# Patient Record
Sex: Female | Born: 1960 | Race: Black or African American | Hispanic: No | Marital: Married
Health system: Southern US, Community
[De-identification: ages and names within clinical notes are randomized; demographics above are authoritative.]

---

## 1999-06-17 ENCOUNTER — Other Ambulatory Visit: Admission: RE | Admit: 1999-06-17 | Discharge: 1999-06-17 | Payer: Self-pay | Admitting: Gynecology

## 2001-02-22 ENCOUNTER — Other Ambulatory Visit: Admission: RE | Admit: 2001-02-22 | Discharge: 2001-02-22 | Payer: Self-pay | Admitting: Gynecology

## 2002-07-26 ENCOUNTER — Other Ambulatory Visit: Admission: RE | Admit: 2002-07-26 | Discharge: 2002-07-26 | Payer: Self-pay | Admitting: Gynecology

## 2002-08-02 ENCOUNTER — Ambulatory Visit (HOSPITAL_COMMUNITY): Admission: RE | Admit: 2002-08-02 | Discharge: 2002-08-02 | Payer: Self-pay | Admitting: Internal Medicine

## 2002-08-04 ENCOUNTER — Ambulatory Visit (HOSPITAL_COMMUNITY): Admission: RE | Admit: 2002-08-04 | Discharge: 2002-08-04 | Payer: Self-pay | Admitting: Gynecology

## 2002-08-04 ENCOUNTER — Encounter: Payer: Self-pay | Admitting: Gynecology

## 2006-01-13 ENCOUNTER — Emergency Department (HOSPITAL_COMMUNITY): Admission: EM | Admit: 2006-01-13 | Discharge: 2006-01-13 | Payer: Self-pay | Admitting: Family Medicine

## 2007-07-26 ENCOUNTER — Other Ambulatory Visit: Admission: RE | Admit: 2007-07-26 | Discharge: 2007-07-26 | Payer: Self-pay | Admitting: Gynecology

## 2007-09-19 ENCOUNTER — Encounter: Admission: RE | Admit: 2007-09-19 | Discharge: 2007-09-27 | Payer: Self-pay | Admitting: Internal Medicine

## 2007-09-23 ENCOUNTER — Encounter (INDEPENDENT_AMBULATORY_CARE_PROVIDER_SITE_OTHER): Payer: Self-pay | Admitting: Obstetrics and Gynecology

## 2007-09-23 ENCOUNTER — Ambulatory Visit (HOSPITAL_COMMUNITY): Admission: RE | Admit: 2007-09-23 | Discharge: 2007-09-23 | Payer: Self-pay | Admitting: Obstetrics and Gynecology

## 2008-12-18 ENCOUNTER — Emergency Department (HOSPITAL_COMMUNITY): Admission: EM | Admit: 2008-12-18 | Discharge: 2008-12-18 | Payer: Self-pay | Admitting: Family Medicine

## 2010-08-26 NOTE — Op Note (Signed)
NAME:  Maria Shah, DEPREY NO.:  [id number]   MEDICAL RECORD NO.:  [id number]          PATIENT TYPE:  AMB   LOCATION:  SDC                           FACILITY:  [facility]   PHYSICIAN:  Miguel Aschoff, M.D.       DATE OF BIRTH:  [birth year]   DATE OF PROCEDURE:  09/23/2007  DATE OF DISCHARGE:                               OPERATIVE REPORT   PREOPERATIVE DIAGNOSES:  1. Menorrhagia.  2. Endometrial polyp.   POSTOPERATIVE DIAGNOSES:  1. Menorrhagia.  2. Endometrial polyp.   PROCEDURES:  1. Cervical dilatation.  2. Hysteroscopy.  3. Removal of endometrial polyp.  4. Uterine curettage.  5. NovaSure endometrial ablation.   SURGEON:  Miguel Aschoff, M.D.   ANESTHESIA:  General.   COMPLICATIONS:  None.   JUSTIFICATION:  The patient is a 50 year old black female with a history  of heavy menses.  The patient was evaluated for this bleeding, underwent  a sonohysterogram that revealed the presence of a polypoid mass within  the confines of the uterus.  In addition, she was noted to have small  myomas.  Because of her heavy bleeding and the cervical polyp she  presents now to undergo D&C, hysteroscopy, and removal of polyp as well  as curettage with endometrial ablation in an effort to control her  bleeding.  The risks and benefits of this procedure were discussed with  the patient.   PROCEDURE:  The patient was taken to the operating and placed in supine  position.  General anesthesia was administered without difficulty.  She  was then placed in dorsal lithotomy position and prepped and draped in  the usual sterile fashion.  Bladder was catheterized.  Once this was  done, an examination under anesthesia was carried out; this revealed  normal external genitalia, normal Bartholin and Skene glands, and normal  urethra.  Vaginal vault was without gross lesion.  The cervix was  without gross lesion.  The uterus was noted be retroflexed and somewhat  irregular in shape, top-normal size.   No adnexal masses were noted. Once  this was done, the anterior cervical lip was grasped with a tenaculum.  The endometrial cavity was then sounded to 9.5 cm.  A cervical length of  3.75 cm was then determined.  Once this was done, the cervix was further  dilated and once this was completed the diagnostic hysteroscope was  advanced through the endocervical canal.  No endocervical lesions were  noted.  The endometrial cavity was smooth except for a polypoid mass  noted in the right fundal portion of the uterus on the lateral wall near  the tubal ostium.  No other abnormalities were noted.  There were no  submucous myomas noted or any other polyps.  At this point, the  hysteroscope was removed.  Polyp forceps were introduced and the  polypoid mass was removed.  After this was done, vigorous sharp  curettage was carried out.  The tissue was then sent for histologic  study on completion of the curettage.  The NovaSure endometrial ablation  unit was then introduced into the cavity of the uterus.  A cavity width  of 4.8 cm was then determined.  At this point, the endometrial ablation  NovaSure unit was turned on after a cavity assessment test was passed,  and a treatment lasting 1 minute 15 seconds was carried out at 137 watts  of power.  On completion of the ablation, the NovaSure unit was removed.  At this point, the hysteroscope was replaced into the cavity of the  uterus.  The cavity appeared to be well ablated, and there was no  evidence of any residual polyps within the confines of the uterus.  At  this point, all instruments were removed.  Prior to removing the  speculum, the cervix was injected with 18 mL 1% Xylocaine for analgesia.  The patient tolerated the procedure well.  The estimated blood loss was  approximately 20 mL.   Plan is for the patient to be discharged home.  Medications for home  include Darvocet N 100 one every 4 hours as needed for pain and  doxycycline 1 twice a  day x3 days.  The patient is to call for any  problems such as fever, pain, or heavy bleeding.      Miguel Aschoff, M.D.  Electronically Signed     AR/MEDQ  D:  09/23/2007  T:  09/24/2007  Job:  [id number]

## 2011-01-08 LAB — BASIC METABOLIC PANEL
BUN: 8
Creatinine, Ser: 0.6
GFR calc Af Amer: >60
GFR calc non Af Amer: >60

## 2011-01-08 LAB — URINALYSIS, ROUTINE W REFLEX MICROSCOPIC
Nitrite: NEGATIVE
Specific Gravity, Urine: 1.02
Urobilinogen, UA: 0.2

## 2011-01-08 LAB — CBC
Platelets: 196
RBC: 4.3
WBC: 7.2

## 2011-01-08 LAB — PREGNANCY, URINE: Preg Test, Ur: NEGATIVE

## 2013-01-16 ENCOUNTER — Other Ambulatory Visit: Payer: Self-pay

## 2013-01-16 DIAGNOSIS — Z1231 Encounter for screening mammogram for malignant neoplasm of breast: Secondary | ICD-10-CM

## 2013-02-03 ENCOUNTER — Ambulatory Visit: Payer: Self-pay

## 2013-12-22 ENCOUNTER — Ambulatory Visit: Payer: Self-pay

## 2013-12-22 NOTE — Progress Notes (Unsigned)
Wrong patient scheduled for nurse clinic.  Clovis Pu, RN

## 2014-06-27 ENCOUNTER — Ambulatory Visit: Payer: Self-pay | Admitting: Family Medicine

## 2014-07-03 ENCOUNTER — Other Ambulatory Visit: Payer: Self-pay

## 2014-07-03 DIAGNOSIS — Z1231 Encounter for screening mammogram for malignant neoplasm of breast: Secondary | ICD-10-CM

## 2014-07-10 ENCOUNTER — Ambulatory Visit: Payer: Self-pay

## 2014-07-11 ENCOUNTER — Ambulatory Visit: Payer: Self-pay

## 2014-07-13 ENCOUNTER — Ambulatory Visit: Payer: Self-pay

## 2017-03-03 ENCOUNTER — Ambulatory Visit: Payer: Self-pay | Admitting: Family Medicine

## 2019-02-17 ENCOUNTER — Telehealth: Payer: Self-pay

## 2019-04-03 NOTE — Telephone Encounter (Signed)
Opened in error. KW °

## 2020-09-23 ENCOUNTER — Other Ambulatory Visit: Payer: Self-pay | Admitting: Internal Medicine

## 2020-09-23 DIAGNOSIS — Z1231 Encounter for screening mammogram for malignant neoplasm of breast: Secondary | ICD-10-CM

## 2020-09-23 DIAGNOSIS — Z780 Asymptomatic menopausal state: Secondary | ICD-10-CM

## 2020-10-07 ENCOUNTER — Ambulatory Visit: Payer: Self-pay | Admitting: Obstetrics and Gynecology

## 2020-11-01 ENCOUNTER — Ambulatory Visit: Payer: Self-pay | Admitting: Obstetrics and Gynecology

## 2020-11-04 ENCOUNTER — Other Ambulatory Visit: Payer: Self-pay

## 2020-11-04 ENCOUNTER — Ambulatory Visit: Payer: PRIVATE HEALTH INSURANCE | Admitting: Obstetrics and Gynecology

## 2020-11-07 ENCOUNTER — Other Ambulatory Visit: Payer: Self-pay

## 2020-11-07 ENCOUNTER — Ambulatory Visit
Admission: RE | Admit: 2020-11-07 | Discharge: 2020-11-07 | Disposition: A | Discharge status: Home / Self Care | Payer: PRIVATE HEALTH INSURANCE | Source: Ambulatory Visit | Attending: Internal Medicine | Admitting: Internal Medicine

## 2020-11-07 DIAGNOSIS — Z1231 Encounter for screening mammogram for malignant neoplasm of breast: Secondary | ICD-10-CM

## 2020-11-07 DIAGNOSIS — Z780 Asymptomatic menopausal state: Secondary | ICD-10-CM

## 2020-11-18 ENCOUNTER — Ambulatory Visit: Payer: Self-pay
